# Patient Record
Sex: Male | Born: 1990 | Race: White | Hispanic: No | Marital: Single | Smoking: Former smoker
Health system: Southern US, Community
[De-identification: ages and names within clinical notes are randomized; demographics above are authoritative.]

## PROBLEM LIST (undated history)

## (undated) DIAGNOSIS — Z789 Other specified health status: Secondary | ICD-10-CM

## (undated) HISTORY — PX: TONSILLECTOMY: SUR1361

---

## 2010-02-17 ENCOUNTER — Ambulatory Visit: Payer: Self-pay | Admitting: Unknown Physician Specialty

## 2010-03-13 ENCOUNTER — Ambulatory Visit: Payer: Self-pay | Admitting: Unknown Physician Specialty

## 2010-04-12 ENCOUNTER — Ambulatory Visit: Payer: Self-pay | Admitting: Unknown Physician Specialty

## 2014-11-19 ENCOUNTER — Emergency Department: Payer: Self-pay | Admitting: Emergency Medicine

## 2015-03-16 ENCOUNTER — Emergency Department
Admit: 2015-03-16 | Disposition: A | Discharge status: Home / Self Care | Payer: Self-pay | Admitting: Emergency Medicine

## 2015-03-16 LAB — BASIC METABOLIC PANEL
Anion Gap: 9 (ref 7–16)
BUN: 9 mg/dL
CHLORIDE: 104 mmol/L
CO2: 29 mmol/L
Calcium, Total: 9.6 mg/dL
Creatinine: 0.87 mg/dL
EGFR (African American): >60
Glucose: 123 mg/dL — ABNORMAL HIGH
POTASSIUM: 3.5 mmol/L
Sodium: 142 mmol/L

## 2015-03-16 LAB — TROPONIN I: Troponin-I: <0.03 ng/mL

## 2015-03-16 LAB — CBC
HCT: 45.5 % (ref 40.0–52.0)
HGB: 15.7 g/dL (ref 13.0–18.0)
MCH: 30.2 pg (ref 26.0–34.0)
MCHC: 34.6 g/dL (ref 32.0–36.0)
MCV: 87 fL (ref 80–100)
Platelet: 263 10*3/uL (ref 150–440)
RBC: 5.22 10*6/uL (ref 4.40–5.90)
RDW: 12.4 % (ref 11.5–14.5)
WBC: 7.6 10*3/uL (ref 3.8–10.6)

## 2015-03-17 LAB — TROPONIN I

## 2015-03-17 LAB — MAGNESIUM: MAGNESIUM: 2.3 mg/dL

## 2016-03-02 ENCOUNTER — Other Ambulatory Visit: Payer: Self-pay | Admitting: Gastroenterology

## 2016-03-02 DIAGNOSIS — R1031 Right lower quadrant pain: Secondary | ICD-10-CM

## 2016-03-02 DIAGNOSIS — R197 Diarrhea, unspecified: Secondary | ICD-10-CM

## 2016-03-04 ENCOUNTER — Ambulatory Visit
Admission: RE | Admit: 2016-03-04 | Discharge: 2016-03-04 | Disposition: A | Discharge status: Home / Self Care | Payer: Commercial Managed Care - PPO | Source: Ambulatory Visit | Attending: Gastroenterology | Admitting: Gastroenterology

## 2016-03-04 DIAGNOSIS — R1031 Right lower quadrant pain: Secondary | ICD-10-CM | POA: Insufficient documentation

## 2016-03-04 DIAGNOSIS — R197 Diarrhea, unspecified: Secondary | ICD-10-CM

## 2016-03-04 MED ORDER — IOPAMIDOL (ISOVUE-300) INJECTION 61%
100.0000 mL | Freq: Once | INTRAVENOUS | Status: AC | PRN
  Administered 2016-03-04: 100 mL via INTRAVENOUS

## 2016-05-06 ENCOUNTER — Encounter: Payer: Self-pay | Admitting: *Deleted

## 2016-05-07 ENCOUNTER — Ambulatory Visit: Payer: Commercial Managed Care - PPO | Admitting: Certified Registered"

## 2016-05-07 ENCOUNTER — Ambulatory Visit
Admission: RE | Admit: 2016-05-07 | Discharge: 2016-05-07 | Disposition: A | Discharge status: Home / Self Care | Payer: Commercial Managed Care - PPO | Source: Ambulatory Visit | Attending: Gastroenterology | Admitting: Gastroenterology

## 2016-05-07 ENCOUNTER — Encounter
Admission: RE | Disposition: A | Discharge status: Home / Self Care | Payer: Self-pay | Source: Ambulatory Visit | Attending: Gastroenterology

## 2016-05-07 ENCOUNTER — Encounter: Payer: Self-pay | Admitting: *Deleted

## 2016-05-07 DIAGNOSIS — K529 Noninfective gastroenteritis and colitis, unspecified: Secondary | ICD-10-CM | POA: Diagnosis present

## 2016-05-07 DIAGNOSIS — Z87891 Personal history of nicotine dependence: Secondary | ICD-10-CM | POA: Insufficient documentation

## 2016-05-07 DIAGNOSIS — R1031 Right lower quadrant pain: Secondary | ICD-10-CM | POA: Diagnosis present

## 2016-05-07 DIAGNOSIS — Z800 Family history of malignant neoplasm of digestive organs: Secondary | ICD-10-CM | POA: Insufficient documentation

## 2016-05-07 HISTORY — DX: Other specified health status: Z78.9

## 2016-05-07 HISTORY — PX: COLONOSCOPY WITH PROPOFOL: SHX5780

## 2016-05-07 SURGERY — COLONOSCOPY WITH PROPOFOL
Anesthesia: General

## 2016-05-07 MED ORDER — FENTANYL CITRATE (PF) 100 MCG/2ML IJ SOLN
INTRAMUSCULAR | Status: DC | PRN
  Administered 2016-05-07: 50 ug via INTRAVENOUS

## 2016-05-07 MED ORDER — PROPOFOL 500 MG/50ML IV EMUL
INTRAVENOUS | Status: DC | PRN
  Administered 2016-05-07: 120 ug/kg/min via INTRAVENOUS

## 2016-05-07 MED ORDER — SODIUM CHLORIDE 0.9 % IV SOLN
INTRAVENOUS | Status: DC
  Administered 2016-05-07: 10:00:00 via INTRAVENOUS

## 2016-05-07 MED ORDER — MIDAZOLAM HCL 5 MG/5ML IJ SOLN
INTRAMUSCULAR | Status: DC | PRN
  Administered 2016-05-07: 1 mg via INTRAVENOUS

## 2016-05-07 MED ORDER — LIDOCAINE 2% (20 MG/ML) 5 ML SYRINGE
INTRAMUSCULAR | Status: DC | PRN
  Administered 2016-05-07: 50 mg via INTRAVENOUS

## 2016-05-07 MED ORDER — PROPOFOL 10 MG/ML IV BOLUS
INTRAVENOUS | Status: DC | PRN
  Administered 2016-05-07: 30 mg via INTRAVENOUS
  Administered 2016-05-07: 70 mg via INTRAVENOUS

## 2016-05-07 MED ORDER — SODIUM CHLORIDE 0.9 % IV SOLN: INTRAVENOUS | Status: DC

## 2016-05-07 NOTE — Anesthesia Preprocedure Evaluation (Signed)
Anesthesia Evaluation  Patient identified by MRN, date of birth, ID band Patient awake    Reviewed: Allergy & Precautions, H&P , NPO status , Patient's Chart, lab work & pertinent test results, reviewed documented beta blocker date and time   Airway Mallampati: II   Neck ROM: full    Dental  (+) Poor Dentition   Pulmonary neg pulmonary ROS, former smoker,    Pulmonary exam normal        Cardiovascular negative cardio ROS Normal cardiovascular exam     Neuro/Psych negative neurological ROS  negative psych ROS   GI/Hepatic negative GI ROS, Neg liver ROS,   Endo/Other  negative endocrine ROS  Renal/GU negative Renal ROS  negative genitourinary   Musculoskeletal   Abdominal   Peds  Hematology negative hematology ROS (+)   Anesthesia Other Findings Past Medical History:   Medical history non-contributory                           Past Surgical History:   TONSILLECTOMY                                               BMI    Body Mass Index   25.08 kg/m 2     Reproductive/Obstetrics                             Anesthesia Physical Anesthesia Plan  ASA: II  Anesthesia Plan: General   Post-op Pain Management:    Induction:   Airway Management Planned:   Additional Equipment:   Intra-op Plan:   Post-operative Plan:   Informed Consent: I have reviewed the patients History and Physical, chart, labs and discussed the procedure including the risks, benefits and alternatives for the proposed anesthesia with the patient or authorized representative who has indicated his/her understanding and acceptance.   Dental Advisory Given  Plan Discussed with: CRNA  Anesthesia Plan Comments:         Anesthesia Quick Evaluation

## 2016-05-07 NOTE — Anesthesia Postprocedure Evaluation (Signed)
Anesthesia Post Note  Patient: Aaron Sweeney  Procedure(s) Performed: Procedure(s) (LRB): COLONOSCOPY WITH PROPOFOL (N/A)  Patient location during evaluation: PACU Anesthesia Type: General Level of consciousness: awake and alert Pain management: pain level controlled Vital Signs Assessment: post-procedure vital signs reviewed and stable Respiratory status: spontaneous breathing, nonlabored ventilation, respiratory function stable and patient connected to nasal cannula oxygen Cardiovascular status: blood pressure returned to baseline and stable Postop Assessment: no signs of nausea or vomiting Anesthetic complications: no    Last Vitals:  Filed Vitals:   05/07/16 0929 05/07/16 1048  BP: 134/74 107/56  Pulse: 63 69  Temp: 36.2 C 35.7 C  Resp: 15 12    Last Pain: There were no vitals filed for this visit.               Yevette EdwardsJames G Adams

## 2016-05-07 NOTE — Op Note (Signed)
[facility] Gastroenterology Patient Name: Aaron Sweeney Procedure Date: 05/07/2016 10:28 AM MRN: [id number] Account #: [id number] Date of Birth: [birth year] Admit Type: Outpatient Age: 25 [facility] Gender: Male Note Status: Finalized Procedure:            Colonoscopy Indications:          Abdominal pain in the right lower quadrant, Chronic                        diarrhea, Family history of colon cancer in a                        first-degree relative Providers:            Ezzard StandingPaul Y. Bluford Kaufmannh, MD Referring MD:         Sallye LatNo Local Md, MD (Referring MD) Medicines:            Monitored Anesthesia Care Complications:        No immediate complications. Procedure:            Pre-Anesthesia Assessment:                       - Prior to the procedure, a History and Physical was                        performed, and patient medications, allergies and                        sensitivities were reviewed. The patient's tolerance of                        previous anesthesia was reviewed.                       - The risks and benefits of the procedure and the                        sedation options and risks were discussed with the                        patient. All questions were answered and informed                        consent was obtained.                       - After reviewing the risks and benefits, the patient                        was deemed in satisfactory condition to undergo the                        procedure.                       After obtaining informed consent, the colonoscope was                        passed under direct vision. Throughout the procedure,  the patient's blood pressure, pulse, and oxygen                        saturations were monitored continuously. The                        Colonoscope was introduced through the anus and                        advanced to the the cecum, identified by appendiceal                         orifice and ileocecal valve. The colonoscopy was                        performed without difficulty. The patient tolerated the                        procedure well. The quality of the bowel preparation                        was good. Findings:      The colon (entire examined portion) appeared normal. Biopsies for       histology were taken with a cold forceps from the entire colon for       evaluation of microscopic colitis.      The terminal ileum appeared normal. Biopsies were taken with a cold       forceps for histology. Impression:           - The entire examined colon is normal. Biopsied.                       - The examined portion of the ileum was normal.                        Biopsied. Recommendation:       - Discharge patient to home.                       - Repeat colonoscopy in 5 years for surveillance.                       - The findings and recommendations were discussed with                        the patient. Procedure Code(s):    --- Professional ---                       [phone], Colonoscopy, flexible; with biopsy, single or                        multiple Diagnosis Code(s):    --- Professional ---                       R10.31, Right lower quadrant pain                       K52.9, Noninfective gastroenteritis and colitis,                        unspecified  Z80.0, Family history of malignant neoplasm of                        digestive organs CPT copyright 2016 [employer]. All rights reserved. The codes documented in this report are preliminary and upon coder review may  be revised to meet current compliance requirements. Wallace Cullens, MD 05/07/2016 10:44:11 AM This report has been signed electronically. Number of Addenda: 0 Note Initiated On: 05/07/2016 10:28 AM Scope Withdrawal Time: 0 hours 4 minutes 55 seconds  Total Procedure Duration: 0 hours 8 minutes 52 seconds       [facility]

## 2016-05-07 NOTE — H&P (Signed)
    Primary Care Physician:  No PCP Per Patient Primary Gastroenterologist:  Dr. Bluford Kaufmannh  Pre-Procedure History & Physical: HPI:  Aaron Sweeney is a 25 y.o. male is here for an colonoscopy   Past Medical History  Diagnosis Date  . Medical history non-contributory     Past Surgical History  Procedure Laterality Date  . Tonsillectomy      Prior to Admission medications   Medication Sig Start Date End Date Taking? Authorizing Provider  hyoscyamine (LEVSIN, ANASPAZ) 0.125 MG tablet Take 0.125 mg by mouth every 4 (four) hours as needed.   Yes Historical Provider, MD    Allergies as of 04/27/2016 - Review Complete 03/04/2016  Allergen Reaction Noted  . 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 Colon cancer Father     Social History   Social History  . Marital Status: Single    Spouse Name: N/A  . Number of Children: N/A  . Years of Education: N/A   Occupational History  . Not on file.   Social History Main Topics  . Smoking status: Former Games developermoker  . Smokeless tobacco: Never Used  . Alcohol Use: No  . Drug Use: No  . Sexual Activity: Not on file   Other Topics Concern  . Not on file   Social History Narrative    Review of Systems: See HPI, otherwise negative ROS  Physical Exam: BP 134/74 mmHg  Pulse 63  Temp(Src) 97.2 F (36.2 C) (Tympanic)  Resp 15  Ht 6' (1.829 m)  Wt 185 lb (83.915 kg)  BMI 25.08 kg/m2  SpO2 99% General:   Alert,  pleasant and cooperative in NAD Head:  Normocephalic and atraumatic. Neck:  Supple; no masses or thyromegaly. Lungs:  Clear throughout to auscultation.    Heart:  Regular rate and rhythm. Abdomen:  Soft, nontender and nondistended. Normal bowel sounds, without guarding, and without rebound.   Neurologic:  Alert and  oriented x4;  grossly normal neurologically.  Impression/Plan: Aaron Sweeney is here for an colonoscopy to be performed for family hx of colon cancer,  diarrhea, RLQ pain.  Risks, benefits, limitations, and alternatives regarding  colonoscopy have been reviewed with the patient.  Questions have been answered.  All parties agreeable.   Kari Montero, Ezzard StandingPAUL Y, MD  05/07/2016, 10:24 AM

## 2016-05-07 NOTE — Transfer of Care (Signed)
Immediate Anesthesia Transfer of Care Note  Patient: Aaron Sweeney  Procedure(s) Performed: Procedure(s): COLONOSCOPY WITH PROPOFOL (N/A)  Patient Location: Endoscopy Unit  Anesthesia Type:General  Level of Consciousness: awake  Airway & Oxygen Therapy: Patient Spontanous Breathing and Patient connected to nasal cannula oxygen  Post-op Assessment: Report given to RN  Post vital signs: Reviewed  Last Vitals:  Filed Vitals:   05/07/16 0929 05/07/16 1048  BP: 134/74 107/56  Pulse: 63 69  Temp: 36.2 C 35.7 C  Resp: 15 12    Last Pain: There were no vitals filed for this visit.       Complications: No apparent anesthesia complications

## 2016-05-10 ENCOUNTER — Encounter: Payer: Self-pay | Admitting: Gastroenterology

## 2016-05-12 LAB — SURGICAL PATHOLOGY

## 2017-04-24 IMAGING — CT CT ABD-PELV W/ CM
1 of 2 series · 15 of 32 positions shown, 19 images · IV contrast (APPLIED)
Comparison: None.

CLINICAL DATA: Right lower quadrant abdominal pain and diarrhea for
the past 10 days.

EXAM:
CT ABDOMEN AND PELVIS WITH CONTRAST
TECHNIQUE: Multidetector CT imaging of the abdomen and pelvis was performed
using the standard protocol following bolus administration of
intravenous contrast.
CONTRAST:  100mL [id number] IOPAMIDOL ([id number]) INJECTION 61%

[Series 2: axial st · axial · 0.70mm/px · z∈[-1022,-572]mm · 15 of 100 slices shown, 19 images]
[im 5/100  soft-tissue]
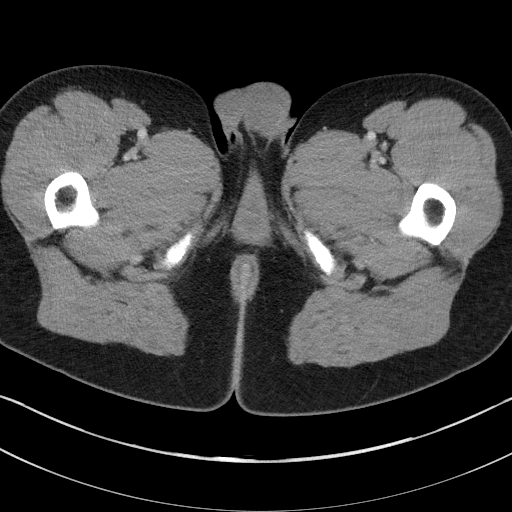
[im 5/100  bone]
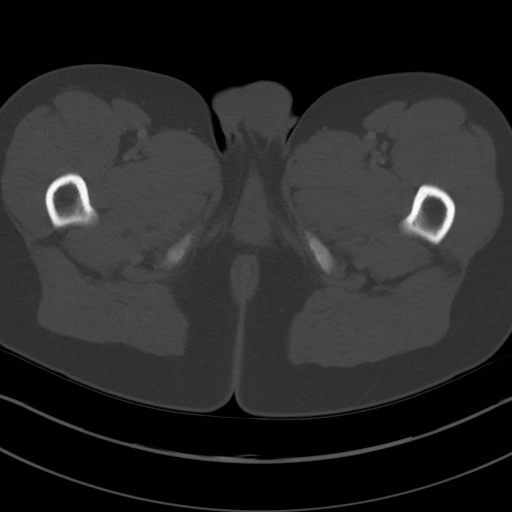
[im 13/100  soft-tissue]
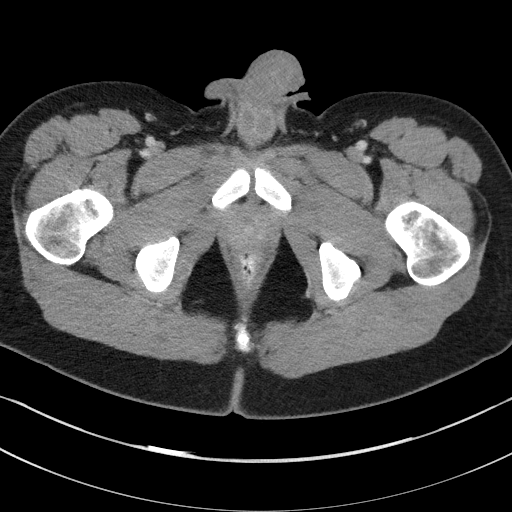
[im 21/100  soft-tissue]
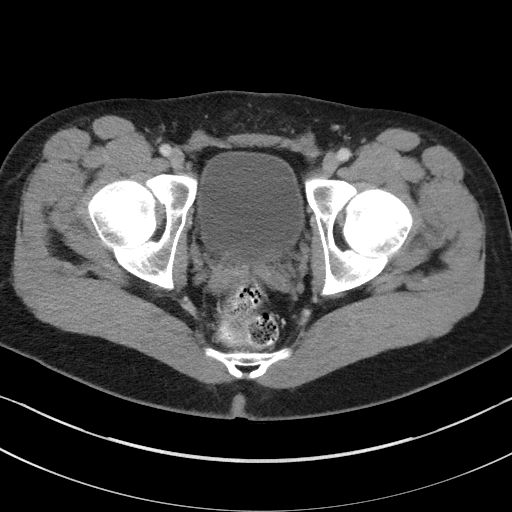
[im 29/100  soft-tissue]
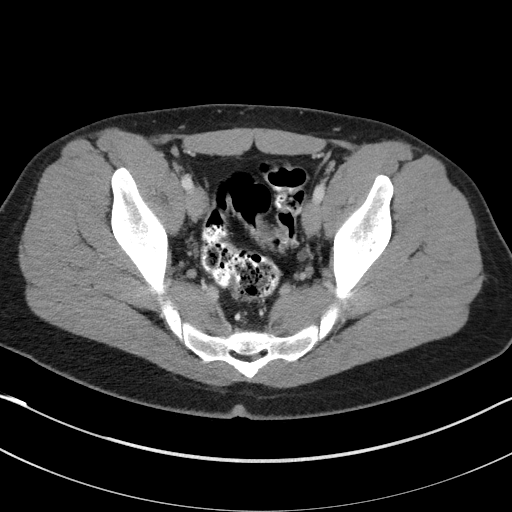
[im 34/100  soft-tissue]
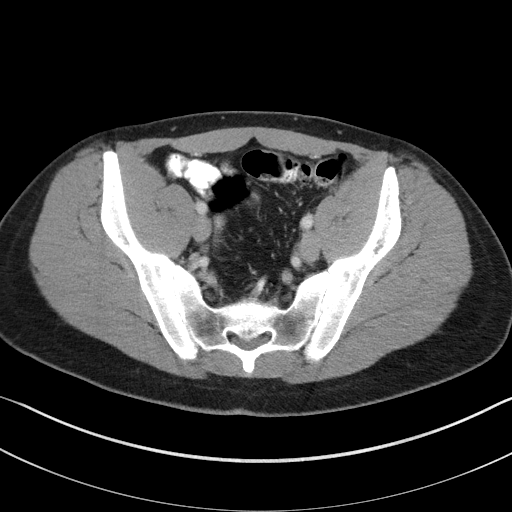
[im 42/100  soft-tissue]
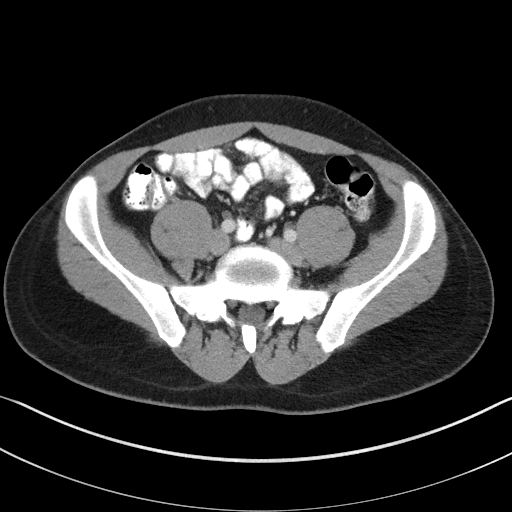
[im 50/100  soft-tissue]
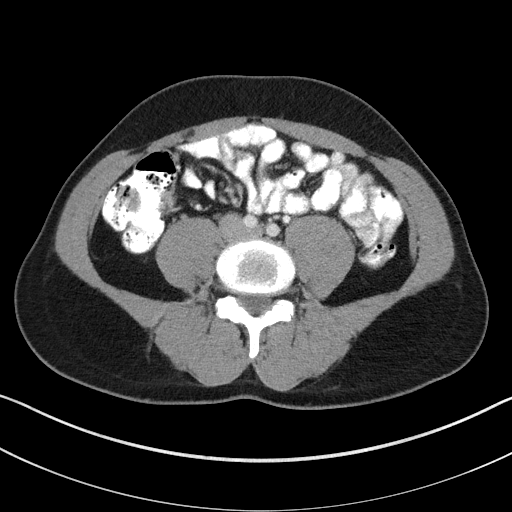
[im 58/100  soft-tissue]
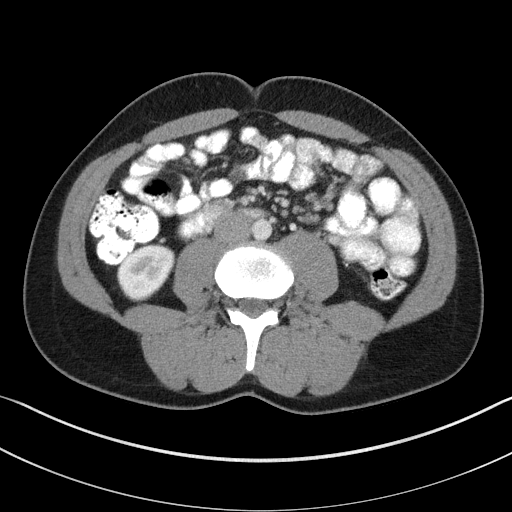
[im 67/100  soft-tissue]
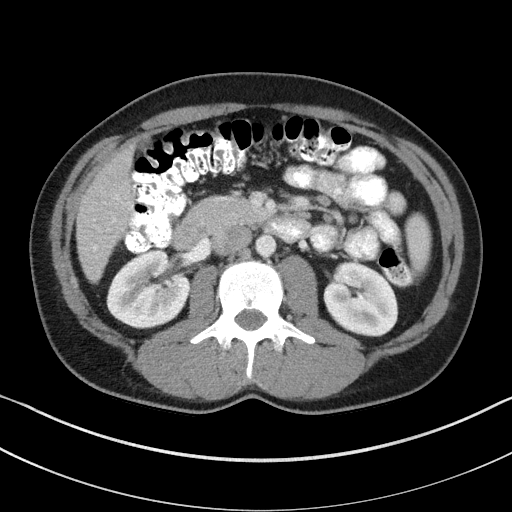
[im 67/100  bone]
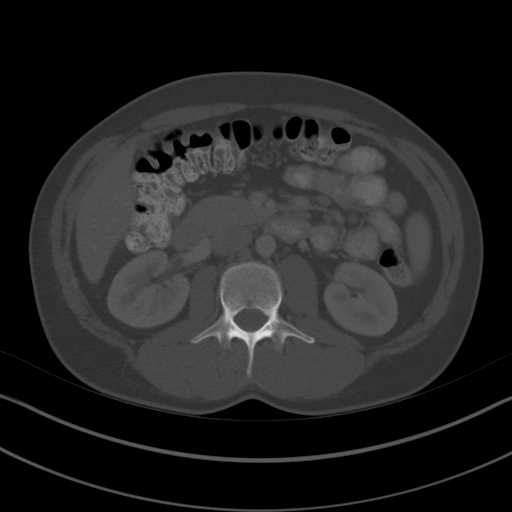
[im 71/100  soft-tissue]
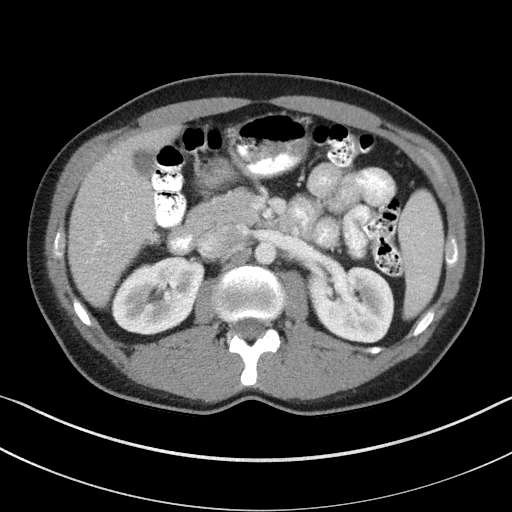
[im 79/100  soft-tissue]
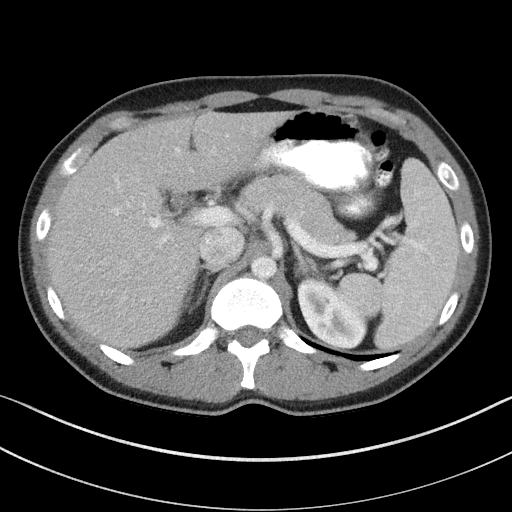
[im 83/100  lung]
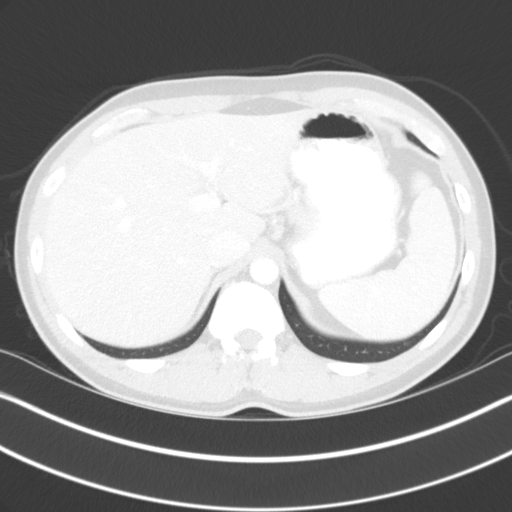
[im 87/100  soft-tissue]
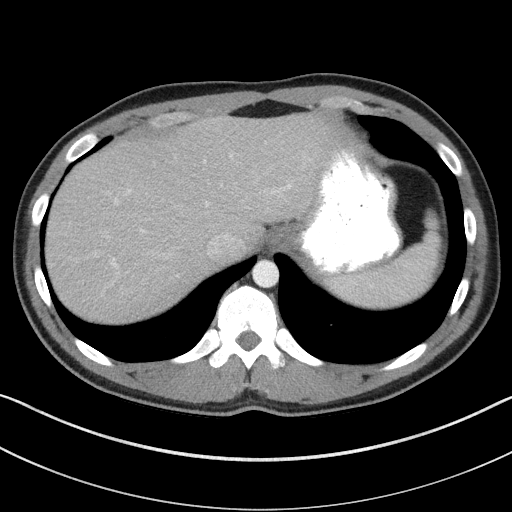
[im 87/100  lung]
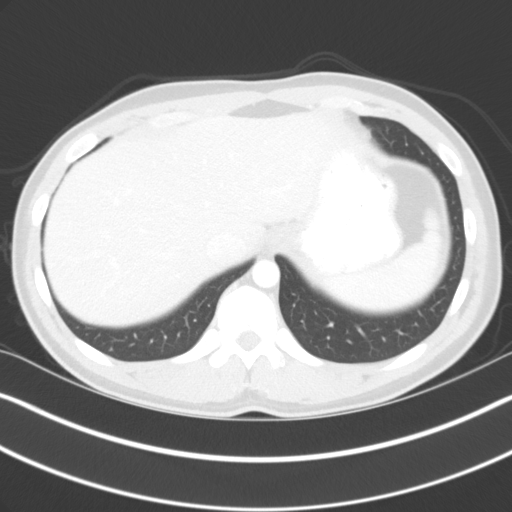
[im 91/100  lung]
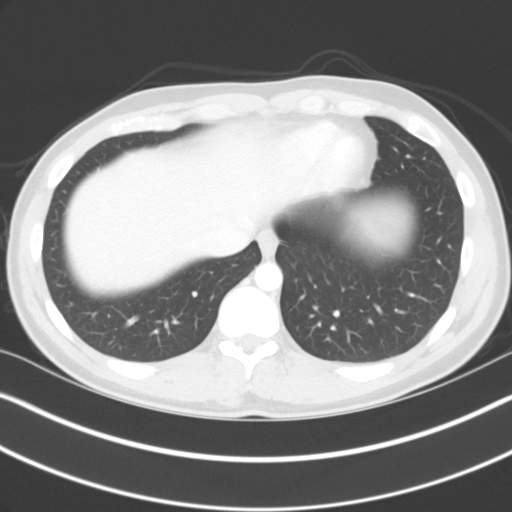
[im 95/100  soft-tissue]
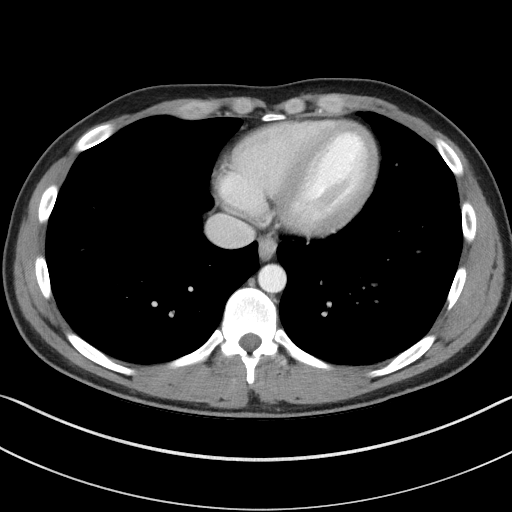
[im 95/100  lung]
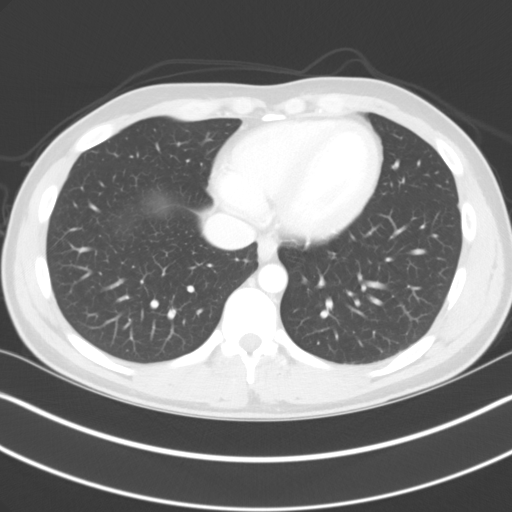

[15 of 32 positions shown; findings below may reference images not displayed]

FINDINGS: Normal hepatic contour. No discrete hepatic lesions. Normal
appearance of the gallbladder given degree distention. No radiopaque
gallstones. No intra extrahepatic biliary duct dilatation. No
ascites.

There is symmetric enhancement of the bilateral kidneys. Bilateral
hypo attenuating renal lesions are too small to adequately
characterize of favored to represent renal cysts. No definite renal
stones on this postcontrast examination. No discrete renal lesions.
No urinary obstruction or perinephric stranding. Normal appearance
of the bilateral adrenal glands, pancreas and spleen.

Ingested enteric contrast extends to the level of the rectum.
Moderate colonic stool burden without evidence of enteric
obstruction. The bowel is normal in course and caliber without wall
thickening or evidence of obstruction. Normal appearance of the
terminal ileum and appendix. No pneumoperitoneum, pneumatosis or
portal venous gas.

Normal caliber of the abdominal aorta. The major branch vessels of
the abdominal aorta appear widely patent on non CTA examination. No
bulky retroperitoneal, mesenteric, pelvic or inguinal
lymphadenopathy.

Normal appearance of the pelvic organs. Normal appearance of the
urinary bladder given degree distention. No free fluid in the pelvic
cul-de-sac.

Limited visualization of lower thorax is negative for focal airspace
opacity or pleural effusion. Normal heart size. No pericardial
effusion.

No acute or aggressive osseous abnormalities.

Regional soft tissues appear normal
IMPRESSION: No explanation for patient's right lower quadrant abdominal pain and
diarrhea. Specifically, no evidence of enteric or urinary
obstruction. Normal appearance of the appendix.
